# Patient Record
Sex: Male | Born: 1995 | Race: Black or African American | Hispanic: No | Marital: Single
Health system: Southern US, Community
[De-identification: ages and names within clinical notes are randomized; demographics above are authoritative.]

---

## 2016-12-13 ENCOUNTER — Encounter (HOSPITAL_COMMUNITY): Payer: Self-pay | Admitting: Emergency Medicine

## 2016-12-13 ENCOUNTER — Emergency Department (HOSPITAL_COMMUNITY)
Admission: EM | Admit: 2016-12-13 | Discharge: 2016-12-13 | Disposition: A | Discharge status: Home / Self Care | Payer: Self-pay | Attending: Emergency Medicine | Admitting: Emergency Medicine

## 2016-12-13 ENCOUNTER — Emergency Department (HOSPITAL_COMMUNITY): Payer: Self-pay

## 2016-12-13 DIAGNOSIS — Y929 Unspecified place or not applicable: Secondary | ICD-10-CM | POA: Insufficient documentation

## 2016-12-13 DIAGNOSIS — S060X0A Concussion without loss of consciousness, initial encounter: Secondary | ICD-10-CM

## 2016-12-13 DIAGNOSIS — Y999 Unspecified external cause status: Secondary | ICD-10-CM | POA: Insufficient documentation

## 2016-12-13 DIAGNOSIS — Y939 Activity, unspecified: Secondary | ICD-10-CM | POA: Insufficient documentation

## 2016-12-13 DIAGNOSIS — S4992XA Unspecified injury of left shoulder and upper arm, initial encounter: Secondary | ICD-10-CM | POA: Insufficient documentation

## 2016-12-13 NOTE — ED Notes (Signed)
Called for patient in waiting room. No answer.  Was told that he had already been called back but patient was not found in room.

## 2016-12-13 NOTE — Discharge Instructions (Signed)
As discussed, get rest and monitor for any worsening or change in condition. Carefully reviewed the instructions provided in this discharge summary. Follow up with a primary care provider.  Return to the emergency department if symptoms worsen, you experience focal deficits, visual disturbances, bad headache, nausea, vomiting or other new concerning symptoms.
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
## 2016-12-13 NOTE — ED Notes (Signed)
Patient Alert and oriented X4. Stable and ambulatory. Patient verbalized understanding of the discharge instructions.  Patient belongings were taken by the patient.  
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
## 2018-03-02 IMAGING — CR DG SHOULDER 2+V*L*
3 series · 3 of 3 positions shown · non-contrast
Comparison: None.

CLINICAL DATA: Pain and swelling

EXAM:
LEFT SHOULDER - 2+ VIEW

[shoulder grashey]
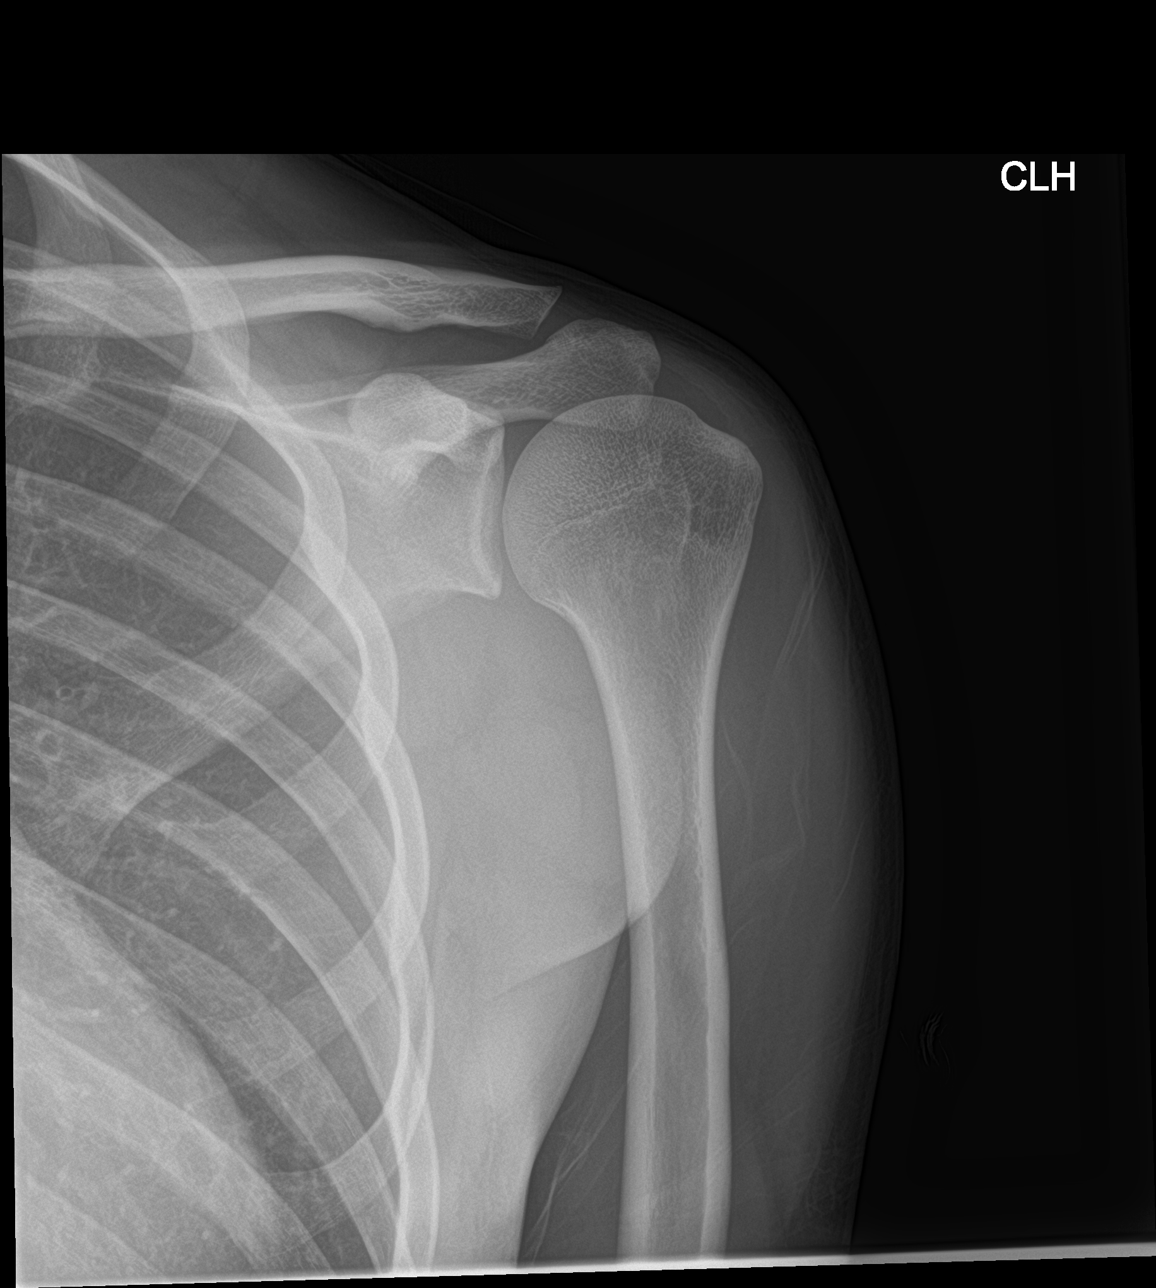

[shoulder y view]
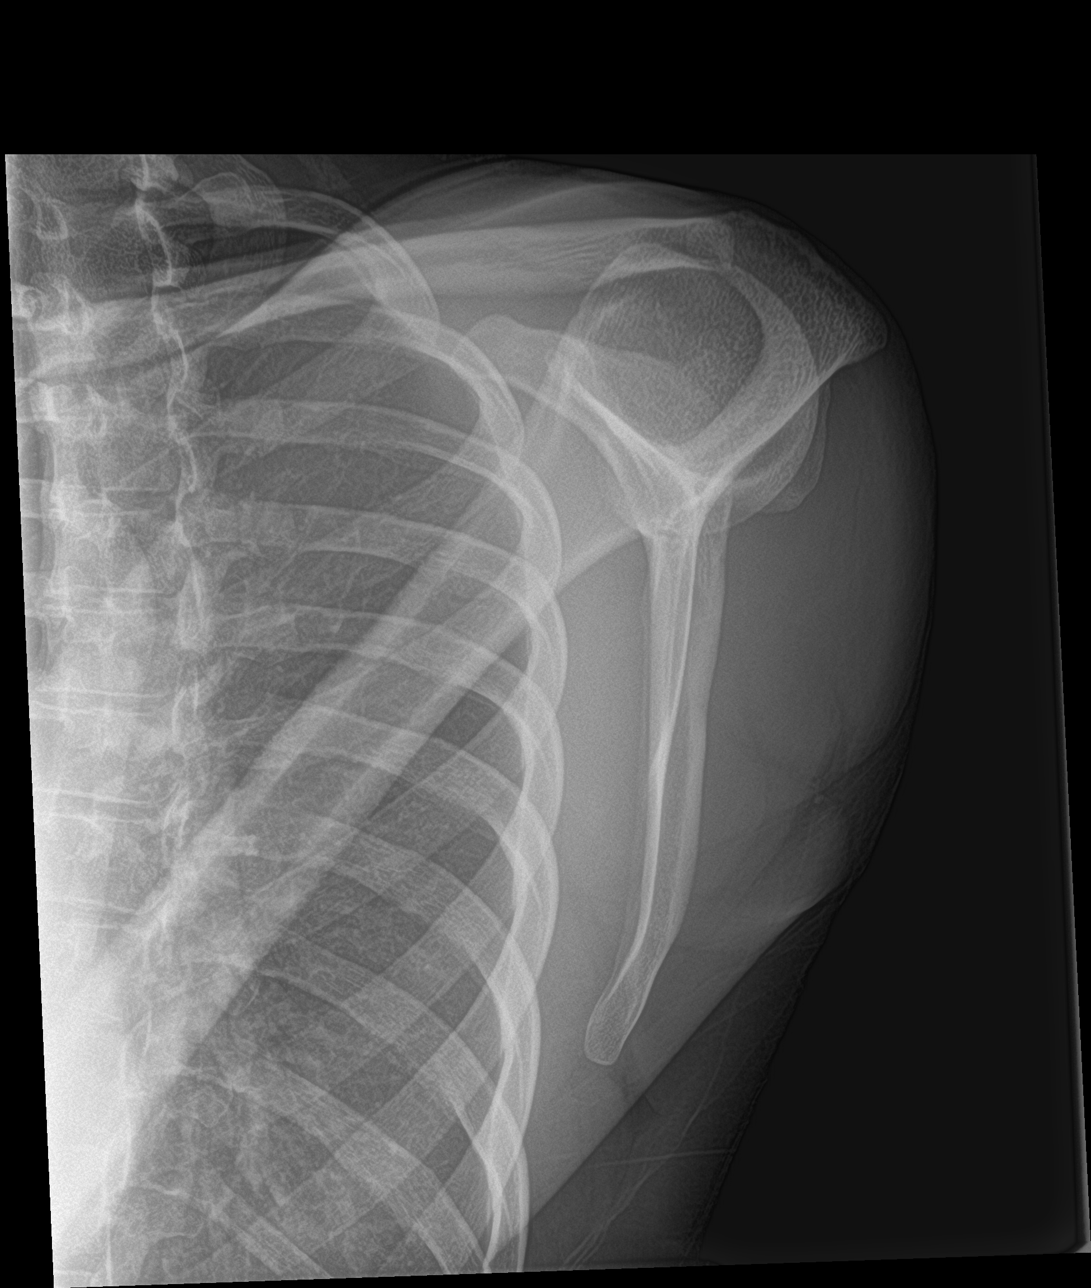

[shoulder axillary]
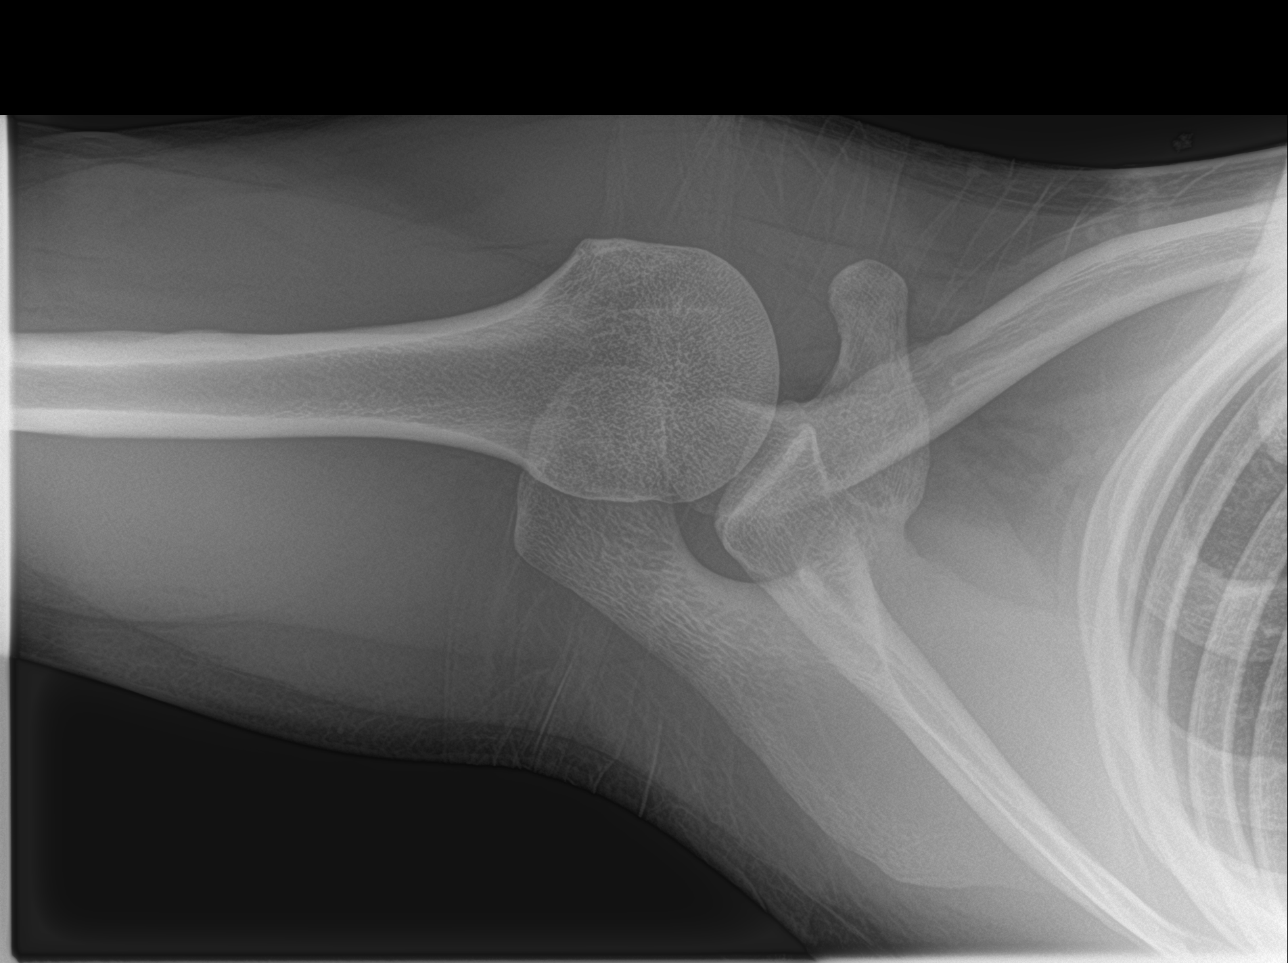

[3 of 3 positions shown; findings below may reference images not displayed]

FINDINGS: Frontal, Y scapular, and axillary images were obtained. There is no
fracture or dislocation. Joint spaces appear normal. No erosive
change. Visualized left lung is clear.
IMPRESSION: No fracture or dislocation.  No appreciable arthropathy.
